# Patient Record
Sex: Female | Born: 1965 | Race: White | Hispanic: No | Marital: Married | Smoking: Never smoker
Health system: Southern US, Community
[De-identification: ages and names within clinical notes are randomized; demographics above are authoritative.]

## PROBLEM LIST (undated history)

## (undated) DIAGNOSIS — G5603 Carpal tunnel syndrome, bilateral upper limbs: Secondary | ICD-10-CM

## (undated) DIAGNOSIS — S069X9A Unspecified intracranial injury with loss of consciousness of unspecified duration, initial encounter: Secondary | ICD-10-CM

## (undated) DIAGNOSIS — Z8719 Personal history of other diseases of the digestive system: Secondary | ICD-10-CM

## (undated) DIAGNOSIS — M25561 Pain in right knee: Secondary | ICD-10-CM

## (undated) DIAGNOSIS — R519 Headache, unspecified: Secondary | ICD-10-CM

## (undated) DIAGNOSIS — E7800 Pure hypercholesterolemia, unspecified: Secondary | ICD-10-CM

## (undated) DIAGNOSIS — S069XAA Unspecified intracranial injury with loss of consciousness status unknown, initial encounter: Secondary | ICD-10-CM

## (undated) DIAGNOSIS — K219 Gastro-esophageal reflux disease without esophagitis: Secondary | ICD-10-CM

## (undated) DIAGNOSIS — E119 Type 2 diabetes mellitus without complications: Secondary | ICD-10-CM

## (undated) DIAGNOSIS — I10 Essential (primary) hypertension: Secondary | ICD-10-CM

## (undated) DIAGNOSIS — R413 Other amnesia: Secondary | ICD-10-CM

## (undated) DIAGNOSIS — R51 Headache: Secondary | ICD-10-CM

## (undated) DIAGNOSIS — S129XXA Fracture of neck, unspecified, initial encounter: Secondary | ICD-10-CM

## (undated) DIAGNOSIS — F419 Anxiety disorder, unspecified: Secondary | ICD-10-CM

## (undated) HISTORY — PX: TUBAL LIGATION: SHX77

## (undated) HISTORY — PX: DILATION AND CURETTAGE OF UTERUS: SHX78

## (undated) HISTORY — PX: ABDOMINAL HYSTERECTOMY: SHX81

## (undated) HISTORY — PX: APPENDECTOMY: SHX54

---

## 2010-12-16 DIAGNOSIS — S0181XA Laceration without foreign body of other part of head, initial encounter: Secondary | ICD-10-CM | POA: Insufficient documentation

## 2011-03-30 DIAGNOSIS — M5030 Other cervical disc degeneration, unspecified cervical region: Secondary | ICD-10-CM | POA: Insufficient documentation

## 2013-12-05 ENCOUNTER — Encounter (HOSPITAL_COMMUNITY): Payer: Self-pay | Admitting: Pharmacy Technician

## 2013-12-05 ENCOUNTER — Other Ambulatory Visit (HOSPITAL_COMMUNITY): Payer: Self-pay | Admitting: Neurosurgery

## 2013-12-06 ENCOUNTER — Encounter (HOSPITAL_COMMUNITY): Payer: Self-pay | Admitting: *Deleted

## 2013-12-07 ENCOUNTER — Encounter (HOSPITAL_COMMUNITY): Payer: BC Managed Care – PPO | Admitting: Critical Care Medicine

## 2013-12-07 ENCOUNTER — Ambulatory Visit (HOSPITAL_COMMUNITY): Payer: BC Managed Care – PPO | Admitting: Critical Care Medicine

## 2013-12-07 ENCOUNTER — Encounter (HOSPITAL_COMMUNITY)
Admission: RE | Disposition: A | Discharge status: Home / Self Care | Payer: Self-pay | Source: Ambulatory Visit | Attending: Neurosurgery

## 2013-12-07 ENCOUNTER — Ambulatory Visit (HOSPITAL_COMMUNITY): Payer: BC Managed Care – PPO

## 2013-12-07 ENCOUNTER — Encounter (HOSPITAL_COMMUNITY): Payer: Self-pay | Admitting: Critical Care Medicine

## 2013-12-07 ENCOUNTER — Ambulatory Visit (HOSPITAL_COMMUNITY)
Admission: RE | Admit: 2013-12-07 | Discharge: 2013-12-07 | Disposition: A | Discharge status: Home / Self Care | Payer: BC Managed Care – PPO | Source: Ambulatory Visit | Attending: Neurosurgery | Admitting: Neurosurgery

## 2013-12-07 DIAGNOSIS — G5601 Carpal tunnel syndrome, right upper limb: Secondary | ICD-10-CM | POA: Diagnosis present

## 2013-12-07 DIAGNOSIS — E119 Type 2 diabetes mellitus without complications: Secondary | ICD-10-CM | POA: Diagnosis not present

## 2013-12-07 DIAGNOSIS — K279 Peptic ulcer, site unspecified, unspecified as acute or chronic, without hemorrhage or perforation: Secondary | ICD-10-CM | POA: Diagnosis not present

## 2013-12-07 DIAGNOSIS — Z01818 Encounter for other preprocedural examination: Secondary | ICD-10-CM

## 2013-12-07 DIAGNOSIS — I10 Essential (primary) hypertension: Secondary | ICD-10-CM | POA: Diagnosis not present

## 2013-12-07 DIAGNOSIS — F419 Anxiety disorder, unspecified: Secondary | ICD-10-CM | POA: Diagnosis not present

## 2013-12-07 DIAGNOSIS — F329 Major depressive disorder, single episode, unspecified: Secondary | ICD-10-CM | POA: Diagnosis not present

## 2013-12-07 DIAGNOSIS — E780 Pure hypercholesterolemia: Secondary | ICD-10-CM | POA: Diagnosis not present

## 2013-12-07 HISTORY — DX: Pure hypercholesterolemia, unspecified: E78.00

## 2013-12-07 HISTORY — DX: Other amnesia: R41.3

## 2013-12-07 HISTORY — DX: Unspecified intracranial injury with loss of consciousness status unknown, initial encounter: S06.9XAA

## 2013-12-07 HISTORY — DX: Fracture of neck, unspecified, initial encounter: S12.9XXA

## 2013-12-07 HISTORY — DX: Headache: R51

## 2013-12-07 HISTORY — PX: CARPAL TUNNEL RELEASE: SHX101

## 2013-12-07 HISTORY — DX: Anxiety disorder, unspecified: F41.9

## 2013-12-07 HISTORY — DX: Carpal tunnel syndrome, bilateral upper limbs: G56.03

## 2013-12-07 HISTORY — DX: Pain in right knee: M25.561

## 2013-12-07 HISTORY — DX: Personal history of other diseases of the digestive system: Z87.19

## 2013-12-07 HISTORY — DX: Unspecified intracranial injury with loss of consciousness of unspecified duration, initial encounter: S06.9X9A

## 2013-12-07 HISTORY — DX: Headache, unspecified: R51.9

## 2013-12-07 HISTORY — DX: Type 2 diabetes mellitus without complications: E11.9

## 2013-12-07 HISTORY — DX: Essential (primary) hypertension: I10

## 2013-12-07 HISTORY — DX: Gastro-esophageal reflux disease without esophagitis: K21.9

## 2013-12-07 LAB — BASIC METABOLIC PANEL
ANION GAP: 12 (ref 5–15)
BUN: 15 mg/dL (ref 6–23)
CALCIUM: 9.5 mg/dL (ref 8.4–10.5)
CO2: 24 meq/L (ref 19–32)
Chloride: 109 mEq/L (ref 96–112)
Creatinine, Ser: 0.85 mg/dL (ref 0.50–1.10)
GFR calc Af Amer: >90 mL/min (ref >90)
GFR, EST NON AFRICAN AMERICAN: 81 mL/min — AB (ref >90)
Glucose, Bld: 122 mg/dL — ABNORMAL HIGH (ref 70–99)
Potassium: 4.2 mEq/L (ref 3.7–5.3)
SODIUM: 145 meq/L (ref 137–147)

## 2013-12-07 LAB — GLUCOSE, CAPILLARY
GLUCOSE-CAPILLARY: 112 mg/dL — AB (ref 70–99)
Glucose-Capillary: 117 mg/dL — ABNORMAL HIGH (ref 70–99)

## 2013-12-07 LAB — CBC
HCT: 34.4 % — ABNORMAL LOW (ref 36.0–46.0)
Hemoglobin: 11.5 g/dL — ABNORMAL LOW (ref 12.0–15.0)
MCH: 33.1 pg (ref 26.0–34.0)
MCHC: 33.4 g/dL (ref 30.0–36.0)
MCV: 99.1 fL (ref 78.0–100.0)
PLATELETS: 174 10*3/uL (ref 150–400)
RBC: 3.47 MIL/uL — ABNORMAL LOW (ref 3.87–5.11)
RDW: 13.4 % (ref 11.5–15.5)
WBC: 5 10*3/uL (ref 4.0–10.5)

## 2013-12-07 SURGERY — CARPAL TUNNEL RELEASE
Anesthesia: Monitor Anesthesia Care | Laterality: Right

## 2013-12-07 MED ORDER — EPHEDRINE SULFATE 50 MG/ML IJ SOLN
INTRAMUSCULAR | Status: AC
  Filled 2013-12-07: qty 1

## 2013-12-07 MED ORDER — PROPOFOL 10 MG/ML IV EMUL
INTRAVENOUS | Status: DC
Start: 2013-12-07 — End: 2013-12-07
  Filled 2013-12-07: qty 100

## 2013-12-07 MED ORDER — PROPOFOL 10 MG/ML IV BOLUS
INTRAVENOUS | Status: DC | PRN
  Administered 2013-12-07 (×2): 20 mg via INTRAVENOUS

## 2013-12-07 MED ORDER — LIDOCAINE-EPINEPHRINE 0.5 %-1:200000 IJ SOLN
INTRAMUSCULAR | Status: DC | PRN
  Administered 2013-12-07: 10 mL

## 2013-12-07 MED ORDER — LIDOCAINE HCL (CARDIAC) 20 MG/ML IV SOLN
INTRAVENOUS | Status: AC
  Filled 2013-12-07: qty 5

## 2013-12-07 MED ORDER — MIDAZOLAM HCL 5 MG/5ML IJ SOLN
INTRAMUSCULAR | Status: DC | PRN
  Administered 2013-12-07: 2 mg via INTRAVENOUS

## 2013-12-07 MED ORDER — ARTIFICIAL TEARS OP OINT
TOPICAL_OINTMENT | OPHTHALMIC | Status: AC
  Filled 2013-12-07: qty 3.5

## 2013-12-07 MED ORDER — LACTATED RINGERS IV SOLN: INTRAVENOUS | Status: DC

## 2013-12-07 MED ORDER — FENTANYL CITRATE 0.05 MG/ML IJ SOLN
INTRAMUSCULAR | Status: DC | PRN
  Administered 2013-12-07: 50 ug via INTRAVENOUS
  Administered 2013-12-07: 25 ug via INTRAVENOUS
  Administered 2013-12-07: 50 ug via INTRAVENOUS
  Administered 2013-12-07: 25 ug via INTRAVENOUS

## 2013-12-07 MED ORDER — OXYCODONE HCL 5 MG/5ML PO SOLN: 5.0000 mg | Freq: Once | ORAL | Status: AC | PRN

## 2013-12-07 MED ORDER — LACTATED RINGERS IV SOLN
INTRAVENOUS | Status: DC | PRN
  Administered 2013-12-07: 07:00:00 via INTRAVENOUS

## 2013-12-07 MED ORDER — PROPOFOL INFUSION 10 MG/ML OPTIME
INTRAVENOUS | Status: DC | PRN
  Administered 2013-12-07: 100 ug/kg/min via INTRAVENOUS

## 2013-12-07 MED ORDER — OXYCODONE HCL 5 MG PO TABS
ORAL_TABLET | ORAL | Status: AC
  Filled 2013-12-07: qty 1

## 2013-12-07 MED ORDER — SUCCINYLCHOLINE CHLORIDE 20 MG/ML IJ SOLN
INTRAMUSCULAR | Status: AC
  Filled 2013-12-07: qty 1

## 2013-12-07 MED ORDER — SODIUM CHLORIDE 0.9 % IJ SOLN
INTRAMUSCULAR | Status: AC
  Filled 2013-12-07: qty 10

## 2013-12-07 MED ORDER — ONDANSETRON HCL 4 MG/2ML IJ SOLN
INTRAMUSCULAR | Status: AC
  Filled 2013-12-07: qty 2

## 2013-12-07 MED ORDER — LIDOCAINE HCL (CARDIAC) 20 MG/ML IV SOLN
INTRAVENOUS | Status: DC | PRN
  Administered 2013-12-07: 40 mg via INTRAVENOUS

## 2013-12-07 MED ORDER — ONDANSETRON HCL 4 MG/2ML IJ SOLN
INTRAMUSCULAR | Status: DC | PRN
  Administered 2013-12-07: 4 mg via INTRAVENOUS

## 2013-12-07 MED ORDER — ROCURONIUM BROMIDE 50 MG/5ML IV SOLN
INTRAVENOUS | Status: AC
Start: 2013-12-07 — End: 2013-12-07
  Filled 2013-12-07: qty 1

## 2013-12-07 MED ORDER — ONDANSETRON HCL 4 MG/2ML IJ SOLN: 4.0000 mg | Freq: Once | INTRAMUSCULAR | Status: DC | PRN

## 2013-12-07 MED ORDER — CEFAZOLIN SODIUM-DEXTROSE 2-3 GM-% IV SOLR
INTRAVENOUS | Status: AC
  Administered 2013-12-07: 2 g via INTRAVENOUS
  Filled 2013-12-07: qty 50

## 2013-12-07 MED ORDER — FENTANYL CITRATE 0.05 MG/ML IJ SOLN
INTRAMUSCULAR | Status: AC
  Filled 2013-12-07: qty 5

## 2013-12-07 MED ORDER — MIDAZOLAM HCL 2 MG/2ML IJ SOLN
INTRAMUSCULAR | Status: AC
  Filled 2013-12-07: qty 2

## 2013-12-07 MED ORDER — 0.9 % SODIUM CHLORIDE (POUR BTL) OPTIME
TOPICAL | Status: DC | PRN
  Administered 2013-12-07: 1000 mL

## 2013-12-07 MED ORDER — PROPOFOL 10 MG/ML IV BOLUS
INTRAVENOUS | Status: AC
  Filled 2013-12-07: qty 20

## 2013-12-07 MED ORDER — FENTANYL CITRATE 0.05 MG/ML IJ SOLN: 25.0000 ug | INTRAMUSCULAR | Status: DC | PRN

## 2013-12-07 MED ORDER — OXYCODONE HCL 5 MG PO TABS
5.0000 mg | ORAL_TABLET | Freq: Once | ORAL | Status: AC | PRN
  Administered 2013-12-07: 5 mg via ORAL

## 2013-12-07 MED ORDER — ACETAMINOPHEN-CODEINE #3 300-30 MG PO TABS: 1.0000 | ORAL_TABLET | Freq: Four times a day (QID) | ORAL | Status: AC | PRN

## 2013-12-07 SURGICAL SUPPLY — 60 items
BANDAGE ELASTIC 3 VELCRO ST LF (GAUZE/BANDAGES/DRESSINGS) ×3 IMPLANT
BLADE SURG 15 STRL LF DISP TIS (BLADE) ×1 IMPLANT
BLADE SURG 15 STRL SS (BLADE) ×2
BNDG COHESIVE 2X5 WHT NS (GAUZE/BANDAGES/DRESSINGS) ×3 IMPLANT
BNDG GAUZE ELAST 4 BULKY (GAUZE/BANDAGES/DRESSINGS) ×3 IMPLANT
CORDS BIPOLAR (ELECTRODE) ×3 IMPLANT
DECANTER SPIKE VIAL GLASS SM (MISCELLANEOUS) ×3 IMPLANT
DERMABOND ADVANCED (GAUZE/BANDAGES/DRESSINGS)
DERMABOND ADVANCED .7 DNX12 (GAUZE/BANDAGES/DRESSINGS) IMPLANT
DRAPE EXTREMITY T 121X128X90 (DRAPE) ×3 IMPLANT
DRSG TELFA 3X8 NADH (GAUZE/BANDAGES/DRESSINGS) ×3 IMPLANT
DURAPREP 26ML APPLICATOR (WOUND CARE) ×3 IMPLANT
GAUZE SPONGE 4X4 12PLY STRL (GAUZE/BANDAGES/DRESSINGS) ×3 IMPLANT
GAUZE SPONGE 4X4 16PLY XRAY LF (GAUZE/BANDAGES/DRESSINGS) ×3 IMPLANT
GLOVE BIO SURGEON STRL SZ 6.5 (GLOVE) IMPLANT
GLOVE BIO SURGEON STRL SZ7 (GLOVE) IMPLANT
GLOVE BIO SURGEON STRL SZ7.5 (GLOVE) IMPLANT
GLOVE BIO SURGEON STRL SZ8 (GLOVE) IMPLANT
GLOVE BIO SURGEON STRL SZ8.5 (GLOVE) IMPLANT
GLOVE BIO SURGEONS STRL SZ 6.5 (GLOVE)
GLOVE BIOGEL M 8.0 STRL (GLOVE) IMPLANT
GLOVE BIOGEL PI IND STRL 7.5 (GLOVE) ×1 IMPLANT
GLOVE BIOGEL PI INDICATOR 7.5 (GLOVE) ×2
GLOVE ECLIPSE 6.5 STRL STRAW (GLOVE) ×3 IMPLANT
GLOVE ECLIPSE 7.0 STRL STRAW (GLOVE) IMPLANT
GLOVE ECLIPSE 7.5 STRL STRAW (GLOVE) ×3 IMPLANT
GLOVE ECLIPSE 8.0 STRL XLNG CF (GLOVE) IMPLANT
GLOVE ECLIPSE 8.5 STRL (GLOVE) IMPLANT
GLOVE EXAM NITRILE LRG STRL (GLOVE) IMPLANT
GLOVE EXAM NITRILE MD LF STRL (GLOVE) IMPLANT
GLOVE EXAM NITRILE XL STR (GLOVE) IMPLANT
GLOVE EXAM NITRILE XS STR PU (GLOVE) IMPLANT
GLOVE INDICATOR 6.5 STRL GRN (GLOVE) IMPLANT
GLOVE INDICATOR 7.0 STRL GRN (GLOVE) IMPLANT
GLOVE INDICATOR 7.5 STRL GRN (GLOVE) IMPLANT
GLOVE INDICATOR 8.0 STRL GRN (GLOVE) IMPLANT
GLOVE INDICATOR 8.5 STRL (GLOVE) IMPLANT
GLOVE OPTIFIT SS 8.0 STRL (GLOVE) IMPLANT
GLOVE SURG SS PI 6.5 STRL IVOR (GLOVE) IMPLANT
GOWN STRL REUS W/ TWL LRG LVL3 (GOWN DISPOSABLE) ×3 IMPLANT
GOWN STRL REUS W/ TWL XL LVL3 (GOWN DISPOSABLE) IMPLANT
GOWN STRL REUS W/TWL 2XL LVL3 (GOWN DISPOSABLE) IMPLANT
GOWN STRL REUS W/TWL LRG LVL3 (GOWN DISPOSABLE) ×6
GOWN STRL REUS W/TWL XL LVL3 (GOWN DISPOSABLE)
KIT BASIN OR (CUSTOM PROCEDURE TRAY) ×3 IMPLANT
KIT ROOM TURNOVER OR (KITS) ×3 IMPLANT
NEEDLE HYPO 25X1 1.5 SAFETY (NEEDLE) ×3 IMPLANT
NS IRRIG 1000ML POUR BTL (IV SOLUTION) ×3 IMPLANT
PACK SURGICAL SETUP 50X90 (CUSTOM PROCEDURE TRAY) ×3 IMPLANT
PAD ARMBOARD 7.5X6 YLW CONV (MISCELLANEOUS) ×9 IMPLANT
STOCKINETTE 4X48 STRL (DRAPES) ×3 IMPLANT
SUT ETHILON 3 0 PS 1 (SUTURE) ×3 IMPLANT
SYR BULB 3OZ (MISCELLANEOUS) ×3 IMPLANT
SYR CONTROL 10ML LL (SYRINGE) ×3 IMPLANT
TOWEL OR 17X24 6PK STRL BLUE (TOWEL DISPOSABLE) ×3 IMPLANT
TOWEL OR 17X26 10 PK STRL BLUE (TOWEL DISPOSABLE) ×3 IMPLANT
TUBE CONNECTING 12'X1/4 (SUCTIONS) ×1
TUBE CONNECTING 12X1/4 (SUCTIONS) ×2 IMPLANT
UNDERPAD 30X30 INCONTINENT (UNDERPADS AND DIAPERS) ×3 IMPLANT
WATER STERILE IRR 1000ML POUR (IV SOLUTION) ×3 IMPLANT
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
## 2013-12-07 NOTE — Anesthesia Preprocedure Evaluation (Addendum)
Anesthesia Evaluation  Patient identified by MRN, date of birth, ID band Patient awake    Reviewed: Allergy & Precautions, H&P , NPO status , Patient's Chart, lab work & pertinent test results, reviewed documented beta blocker date and time   Airway Mallampati: II TM Distance: >3 FB Neck ROM: Full    Dental  (+) Dental Advisory Given, Teeth Intact   Pulmonary  breath sounds clear to auscultation        Cardiovascular hypertension, Pt. on medications and Pt. on home beta blockers Rhythm:Regular Rate:Normal     Neuro/Psych  Headaches, PSYCHIATRIC DISORDERS Anxiety  Neuromuscular disease    GI/Hepatic hiatal hernia, GERD-  ,  Endo/Other  diabetes  Renal/GU      Musculoskeletal   Abdominal   Peds  Hematology   Anesthesia Other Findings   Reproductive/Obstetrics                          Anesthesia Physical Anesthesia Plan  ASA: II  Anesthesia Plan: MAC   Post-op Pain Management:    Induction: Intravenous  Airway Management Planned: Simple Face Mask  Additional Equipment:   Intra-op Plan:   Post-operative Plan:   Informed Consent: I have reviewed the patients History and Physical, chart, labs and discussed the procedure including the risks, benefits and alternatives for the proposed anesthesia with the patient or authorized representative who has indicated his/her understanding and acceptance.   Dental advisory given  Plan Discussed with: Anesthesiologist, Surgeon and CRNA  Anesthesia Plan Comments:        Anesthesia Quick Evaluation

## 2013-12-07 NOTE — Anesthesia Postprocedure Evaluation (Signed)
  Anesthesia Post-op Note  Patient: Deborah Lin  Procedure(s) Performed: Procedure(s) with comments: CARPAL TUNNEL RELEASE (Right) - Right Carpal tunnel release  Patient Location: PACU  Anesthesia Type:MAC  Level of Consciousness: awake, alert  and oriented  Airway and Oxygen Therapy: Patient Spontanous Breathing  Post-op Pain: none  Post-op Assessment: Post-op Vital signs reviewed, Patient's Cardiovascular Status Stable, Respiratory Function Stable, Patent Airway, No signs of Nausea or vomiting and Pain level controlled  Post-op Vital Signs: stable  Last Vitals:  Filed Vitals:   12/07/13 0935  BP:   Pulse:   Temp: 36.7 C  Resp:     Complications: No apparent anesthesia complications

## 2013-12-07 NOTE — Anesthesia Procedure Notes (Signed)
Procedure Name: MAC Date/Time: 12/07/2013 8:08 AM Performed by: Elon AlasLEE, Kaleesi Guyton BROWN Pre-anesthesia Checklist: Patient identified, Timeout performed, Emergency Drugs available, Suction available and Patient being monitored Patient Re-evaluated:Patient Re-evaluated prior to inductionOxygen Delivery Method: Simple face mask Intubation Type: IV induction Placement Confirmation: positive ETCO2 and breath sounds checked- equal and bilateral Dental Injury: Teeth and Oropharynx as per pre-operative assessment

## 2013-12-07 NOTE — Transfer of Care (Signed)
Immediate Anesthesia Transfer of Care Note  Patient: Deborah Lin  Procedure(s) Performed: Procedure(s) with comments: CARPAL TUNNEL RELEASE (Right) - Right Carpal tunnel release  Patient Location: PACU  Anesthesia Type:MAC  Level of Consciousness: awake, alert  and oriented  Airway & Oxygen Therapy: Patient Spontanous Breathing  Post-op Assessment: Report given to PACU RN, Post -op Vital signs reviewed and stable and Patient moving all extremities X 4  Post vital signs: Reviewed and stable  Complications: No apparent anesthesia complications

## 2013-12-07 NOTE — Discharge Instructions (Signed)
Carpal Tunnel Release (Repair)  Care After    Refer to this sheet in the next few weeks. These discharge instructions provide you with general information on caring for yourself after you leave the hospital. Your caregiver may also give you specific instructions. Your treatment has been planned according to the most current medical practices available, but unavoidable complications sometimes occur. If you have any problems or questions after discharge, please call your caregiver.  HOME CARE INSTRUCTIONS  Have a responsible person with you for 24 hours.  Do not drive a car or take public transportation for 24 hours.  Only take over-the-counter or prescription medicines for pain, discomfort, or fever as directed by your caregiver. Take them as directed.  You may put ice on the palm side of the affected wrist.  Put ice in a plastic bag.  Place a towel between your skin and the bag.  Leave the ice on for 15 to 20 minutes, 3 to 4 times per day.  Remove the Ace bandage approximately 6pm the day of surgery Keep your hand raised (elevated) above the level of your heart as much as possible. This keeps swelling down and helps with discomfort.  Change bandages (dressings) as directed.  Keep the wound clean and dry. SEEK MEDICAL CARE IF:  You develop pain not relieved with medicines.  You develop numbness of your hand.  You develop bleeding from your surgical site.  You have an oral temperature above 102 F (38.9 C).  You develop redness or swelling of the surgical site.  You develop new, unexplained problems. SEEK IMMEDIATE MEDICAL CARE IF:  You develop a rash.  You have difficulty breathing.  You develop any reaction or side effects to medicines given. MAKE SURE YOU:  Understand these instructions.  Will watch your condition.  Will get help right away if you are not doing well or get worse.   What to eat:  For your first meals, you should eat lightly; only small meals initially.  If you do  not have nausea, you may eat larger meals.  Avoid spicy, greasy and heavy food.    General Anesthesia, Adult, Care After  Refer to this sheet in the next few weeks. These instructions provide you with information on caring for yourself after your procedure. Your health care provider may also give you more specific instructions. Your treatment has been planned according to current medical practices, but problems sometimes occur. Call your health care provider if you have any problems or questions after your procedure.  WHAT TO EXPECT AFTER THE PROCEDURE  After the procedure, it is typical to experience:  Sleepiness.  Nausea and vomiting. HOME CARE INSTRUCTIONS  For the first 24 hours after general anesthesia:  Have a responsible person with you.  Do not drive a car. If you are alone, do not take public transportation.  Do not drink alcohol.  Do not take medicine that has not been prescribed by your health care provider.  Do not sign important papers or make important decisions.  You may resume a normal diet and activities as directed by your health care provider.  Change bandages (dressings) as directed.  If you have questions or problems that seem related to general anesthesia, call the hospital and ask for the anesthetist or anesthesiologist on call. SEEK MEDICAL CARE IF:  You have nausea and vomiting that continue the day after anesthesia.  You develop a rash. SEEK IMMEDIATE MEDICAL CARE IF:  You have difficulty breathing.  You have chest  pain.  You have any allergic problems. Document Released: 05/25/2000 Document Revised: 10/19/2012 Document Reviewed: 09/01/2012  [facility] Patient Information 2014 [place], [place].

## 2013-12-07 NOTE — Op Note (Signed)
12/07/2013  8:54 AM  PATIENT:  Deborah Lin  48 y.o. female with severe bilateral carpal tunnel syndrome, she has decided to undergo a right carpal tunnel release  PRE-OPERATIVE DIAGNOSIS:  carpal tunnel syndrome right  POST-OPERATIVE DIAGNOSIS:  carpal tunnel syndrome right  PROCEDURE:  Procedure(s):right CARPAL TUNNEL RELEASE  SURGEON:  Surgeon(s): Coletta MemosKyle Lorry Anastasi, MD  ASSISTANTS:none  ANESTHESIA:   local and IV sedation  EBL:  Total I/O In: 500 [I.V.:500] Out: 5 [Blood:5]  BLOOD ADMINISTERED:none  CELL SAVER GIVEN:none  COUNT:per nursing  DRAINS: none   SPECIMEN:  No Specimen  DICTATION: [name] was taken to the operating room, and given iv sedation. Her right upper extremity was prepared and draped in a sterile manner. I infiltrated lidocaine into the planned incision starting at the distal palmar crease, extending into the palm. I opened the incision with a 15 blade and dissected sharply with both the blade and scissors until the transverse carpal ligament was opened. I used the scissors to divide the ligament both proximally, and distally until I felt the decompression was adequate. I irrigated the wound, then closed with a nylon suture. I applied a sterile dressing.   PLAN OF CARE: Discharge to home after PACU  PATIENT DISPOSITION:  PACU - hemodynamically stable.   Delay start of Pharmacological VTE agent (>24hrs) due to surgical blood loss or risk of bleeding:  yes

## 2013-12-08 ENCOUNTER — Encounter (HOSPITAL_COMMUNITY): Payer: Self-pay | Admitting: Neurosurgery

## 2013-12-26 ENCOUNTER — Inpatient Hospital Stay (HOSPITAL_COMMUNITY): Admission: RE | Admit: 2013-12-26 | Payer: BC Managed Care – PPO | Source: Ambulatory Visit

## 2013-12-29 ENCOUNTER — Encounter (HOSPITAL_COMMUNITY): Admission: RE | Payer: Self-pay | Source: Ambulatory Visit

## 2013-12-29 ENCOUNTER — Ambulatory Visit (HOSPITAL_COMMUNITY): Admission: RE | Admit: 2013-12-29 | Payer: BC Managed Care – PPO | Source: Ambulatory Visit | Admitting: Neurosurgery

## 2013-12-29 SURGERY — CARPAL TUNNEL RELEASE
Anesthesia: Monitor Anesthesia Care | Laterality: Left

## 2014-05-01 DIAGNOSIS — D3A8 Other benign neuroendocrine tumors: Secondary | ICD-10-CM | POA: Insufficient documentation

## 2014-05-01 DIAGNOSIS — R590 Localized enlarged lymph nodes: Secondary | ICD-10-CM | POA: Insufficient documentation

## 2014-05-10 DIAGNOSIS — I10 Essential (primary) hypertension: Secondary | ICD-10-CM | POA: Insufficient documentation

## 2014-05-10 DIAGNOSIS — M170 Bilateral primary osteoarthritis of knee: Secondary | ICD-10-CM | POA: Insufficient documentation

## 2014-05-10 DIAGNOSIS — K219 Gastro-esophageal reflux disease without esophagitis: Secondary | ICD-10-CM | POA: Insufficient documentation

## 2014-05-10 DIAGNOSIS — G43909 Migraine, unspecified, not intractable, without status migrainosus: Secondary | ICD-10-CM | POA: Insufficient documentation

## 2014-05-10 DIAGNOSIS — E119 Type 2 diabetes mellitus without complications: Secondary | ICD-10-CM | POA: Insufficient documentation

## 2014-05-22 DIAGNOSIS — E860 Dehydration: Secondary | ICD-10-CM | POA: Insufficient documentation

## 2014-08-15 DIAGNOSIS — K5900 Constipation, unspecified: Secondary | ICD-10-CM | POA: Insufficient documentation

## 2014-09-26 DIAGNOSIS — R300 Dysuria: Secondary | ICD-10-CM | POA: Insufficient documentation

## 2014-09-26 DIAGNOSIS — R195 Other fecal abnormalities: Secondary | ICD-10-CM | POA: Insufficient documentation

## 2015-02-07 IMAGING — CR DG CHEST 2V
2 series · 2 of 2 positions shown · non-contrast
Comparison: None.

CLINICAL DATA: Preoperative chest radiograph, for planned carpal
tunnel surgery. Initial encounter.

EXAM:
CHEST  2 VIEW

[w chest pa]
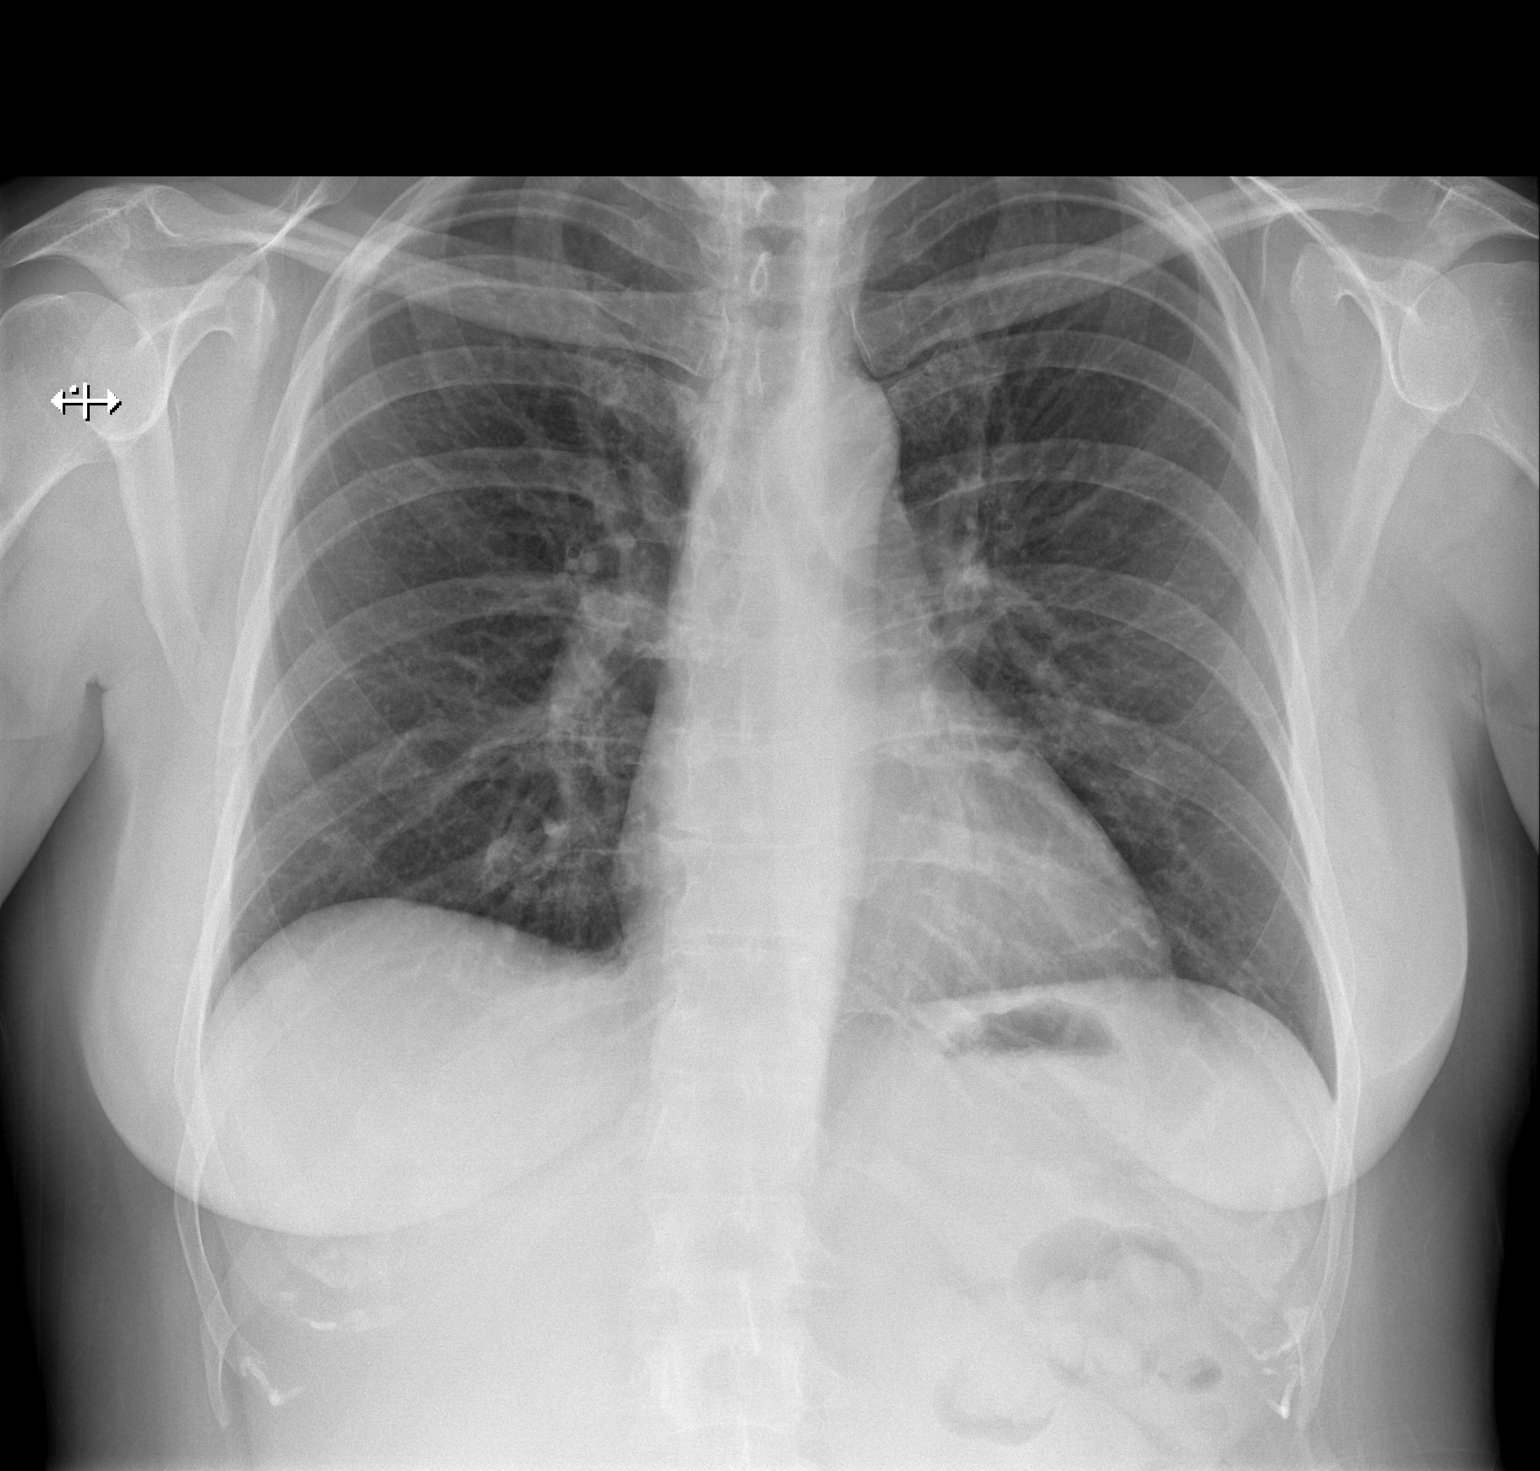

[w chest lat]
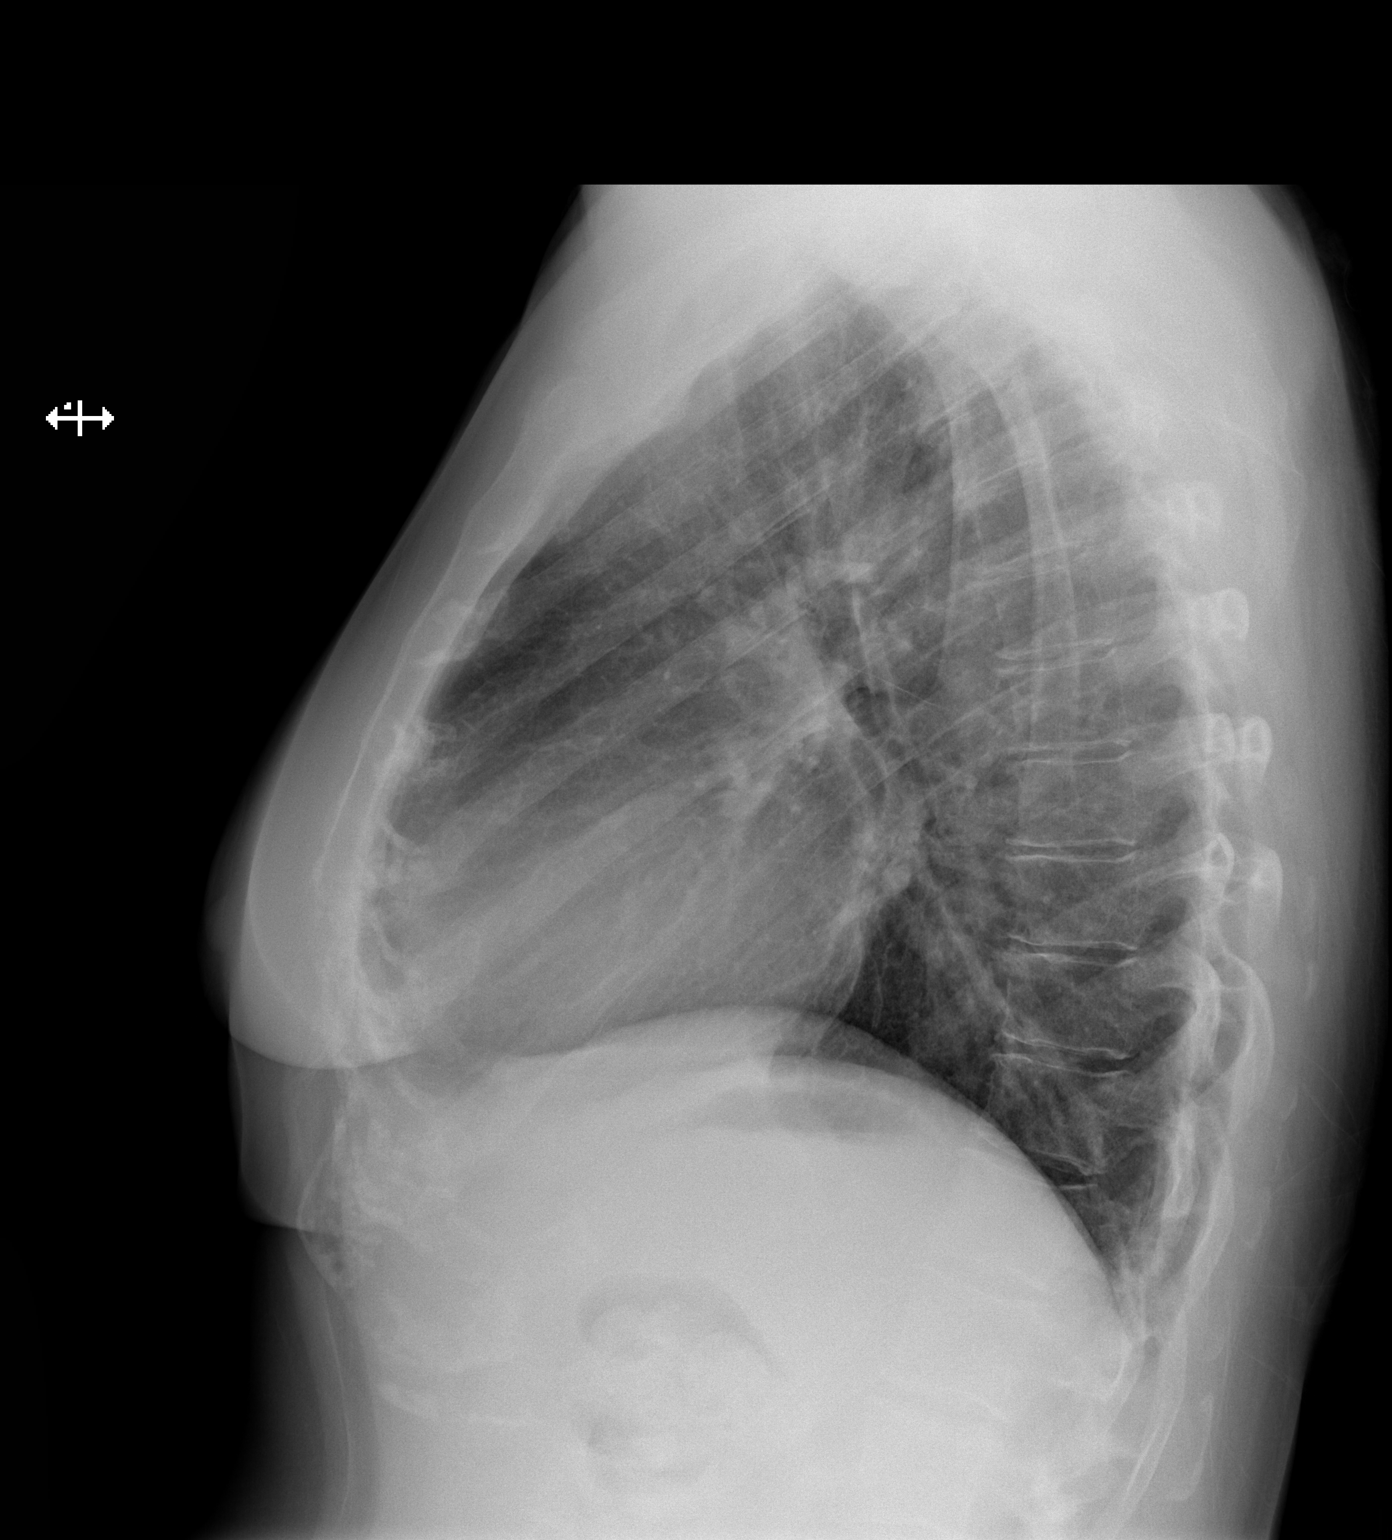

[2 of 2 positions shown; findings below may reference images not displayed]

FINDINGS: The lungs are well-aerated and clear. There is no evidence of focal
opacification, pleural effusion or pneumothorax.

The heart is normal in size; the mediastinal contour is within
normal limits. No acute osseous abnormalities are seen.
IMPRESSION: No acute cardiopulmonary process seen.

## 2017-06-24 DIAGNOSIS — K432 Incisional hernia without obstruction or gangrene: Secondary | ICD-10-CM | POA: Insufficient documentation

## 2023-05-24 ENCOUNTER — Ambulatory Visit
Admission: EM | Admit: 2023-05-24 | Discharge: 2023-05-24 | Disposition: A | Discharge status: Home / Self Care | Attending: Family Medicine | Admitting: Family Medicine

## 2023-05-24 DIAGNOSIS — E119 Type 2 diabetes mellitus without complications: Secondary | ICD-10-CM | POA: Insufficient documentation
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
## 2023-05-24 NOTE — ED Notes (Signed)
 Patient is being discharged from the Urgent Care and sent to the Emergency Department via POV (family) . Per [place], [place], patient is in need of higher level of care due to CT for intracranial injury. Patient is aware and verbalizes understanding of plan of care.  Vitals:   05/24/23 1739  BP: (!) 194/98  Pulse: 92  Resp: 16  Temp: 98.1 F (36.7 C)  SpO2: 96%

## 2023-05-24 NOTE — ED Provider Notes (Signed)
 Patient evaluated through triage.  She suffered a severe fall onto concrete this morning.  Has since had significant facial pain, facial swelling especially about the left side of her face.  Denies loss consciousness but is concerned about intracranial injury.  This concern is valid.  I emphasized need to present to the emergency room now for further evaluation and intervention.   Wallis Bamberg, PA-C 05/24/23 1800
